# Patient Record
Sex: Female | Born: 1995 | Race: Black or African American | Hispanic: No | Marital: Single | Smoking: Former smoker
Health system: Southern US, Community
[De-identification: ages and names within clinical notes are randomized; demographics above are authoritative.]

---

## 2017-05-14 ENCOUNTER — Emergency Department (HOSPITAL_COMMUNITY): Payer: Self-pay

## 2017-05-14 ENCOUNTER — Emergency Department (HOSPITAL_COMMUNITY)
Admission: EM | Admit: 2017-05-14 | Discharge: 2017-05-14 | Disposition: A | Discharge status: Home / Self Care | Payer: Self-pay | Attending: Emergency Medicine | Admitting: Emergency Medicine

## 2017-05-14 ENCOUNTER — Encounter (HOSPITAL_COMMUNITY): Payer: Self-pay | Admitting: Emergency Medicine

## 2017-05-14 DIAGNOSIS — R509 Fever, unspecified: Secondary | ICD-10-CM | POA: Insufficient documentation

## 2017-05-14 DIAGNOSIS — R1030 Lower abdominal pain, unspecified: Secondary | ICD-10-CM

## 2017-05-14 DIAGNOSIS — N760 Acute vaginitis: Secondary | ICD-10-CM | POA: Insufficient documentation

## 2017-05-14 DIAGNOSIS — B9689 Other specified bacterial agents as the cause of diseases classified elsewhere: Secondary | ICD-10-CM

## 2017-05-14 DIAGNOSIS — Z87891 Personal history of nicotine dependence: Secondary | ICD-10-CM | POA: Insufficient documentation

## 2017-05-14 DIAGNOSIS — R112 Nausea with vomiting, unspecified: Secondary | ICD-10-CM | POA: Insufficient documentation

## 2017-05-14 LAB — WET PREP, GENITAL
SPERM: NONE SEEN
TRICH WET PREP: NONE SEEN
YEAST WET PREP: NONE SEEN

## 2017-05-14 LAB — URINALYSIS, ROUTINE W REFLEX MICROSCOPIC
BILIRUBIN URINE: NEGATIVE
Glucose, UA: NEGATIVE mg/dL
HGB URINE DIPSTICK: NEGATIVE
KETONES UR: NEGATIVE mg/dL
Leukocytes, UA: NEGATIVE
Nitrite: NEGATIVE
PROTEIN: 30 mg/dL — AB
SPECIFIC GRAVITY, URINE: 1.024 (ref 1.005–1.030)
pH: 5 (ref 5.0–8.0)

## 2017-05-14 LAB — COMPREHENSIVE METABOLIC PANEL
ALK PHOS: 54 U/L (ref 38–126)
ALT: 17 U/L (ref 14–54)
AST: 22 U/L (ref 15–41)
Albumin: 4 g/dL (ref 3.5–5.0)
Anion gap: 9 (ref 5–15)
BUN: 10 mg/dL (ref 6–20)
CALCIUM: 9.2 mg/dL (ref 8.9–10.3)
CHLORIDE: 104 mmol/L (ref 101–111)
CO2: 23 mmol/L (ref 22–32)
CREATININE: 0.73 mg/dL (ref 0.44–1.00)
GFR calc non Af Amer: >60 mL/min (ref >60)
GLUCOSE: 83 mg/dL (ref 65–99)
Potassium: 4.2 mmol/L (ref 3.5–5.1)
Sodium: 136 mmol/L (ref 135–145)
Total Bilirubin: 0.4 mg/dL (ref 0.3–1.2)
Total Protein: 7.4 g/dL (ref 6.5–8.1)

## 2017-05-14 LAB — CBC WITH DIFFERENTIAL/PLATELET
BASOS PCT: 0 %
Basophils Absolute: 0 10*3/uL (ref 0.0–0.1)
EOS ABS: 0.1 10*3/uL (ref 0.0–0.7)
Eosinophils Relative: 1 %
HCT: 37.8 % (ref 36.0–46.0)
HEMOGLOBIN: 12.4 g/dL (ref 12.0–15.0)
LYMPHS ABS: 3.7 10*3/uL (ref 0.7–4.0)
Lymphocytes Relative: 29 %
MCH: 24.7 pg — AB (ref 26.0–34.0)
MCHC: 32.8 g/dL (ref 30.0–36.0)
MCV: 75.1 fL — ABNORMAL LOW (ref 78.0–100.0)
MONO ABS: 0.8 10*3/uL (ref 0.1–1.0)
MONOS PCT: 6 %
Neutro Abs: 8.1 10*3/uL — ABNORMAL HIGH (ref 1.7–7.7)
Neutrophils Relative %: 64 %
Platelets: 258 10*3/uL (ref 150–400)
RBC: 5.03 MIL/uL (ref 3.87–5.11)
RDW: 14.8 % (ref 11.5–15.5)
WBC: 12.7 10*3/uL — ABNORMAL HIGH (ref 4.0–10.5)

## 2017-05-14 LAB — POC URINE PREG, ED: Preg Test, Ur: NEGATIVE

## 2017-05-14 LAB — LIPASE, BLOOD: LIPASE: 30 U/L (ref 11–51)

## 2017-05-14 MED ORDER — IOPAMIDOL (ISOVUE-300) INJECTION 61%
INTRAVENOUS | Status: AC
  Administered 2017-05-14: 100 mL
  Filled 2017-05-14: qty 100

## 2017-05-14 MED ORDER — KETOROLAC TROMETHAMINE 30 MG/ML IJ SOLN
30.0000 mg | Freq: Once | INTRAMUSCULAR | Status: AC
  Administered 2017-05-14: 30 mg via INTRAVENOUS
  Filled 2017-05-14: qty 1

## 2017-05-14 MED ORDER — SODIUM CHLORIDE 0.9 % IV BOLUS (SEPSIS)
1000.0000 mL | Freq: Once | INTRAVENOUS | Status: AC
Start: 2017-05-14 — End: 2017-05-14
  Administered 2017-05-14: 1000 mL via INTRAVENOUS

## 2017-05-14 MED ORDER — METRONIDAZOLE 500 MG PO TABS: 500.0000 mg | ORAL_TABLET | Freq: Two times a day (BID) | ORAL | 0 refills | Status: AC

## 2017-05-14 NOTE — ED Notes (Signed)
Pt appears to have started her period. Pt given washcloths, OB wipes, and menstrual pad. Chux pad placed under pt. EDP aware.

## 2017-05-14 NOTE — ED Notes (Addendum)
Pt reports BL flank/back pain and intermittent bilateral leg pain x 1 week worsening these last 3 days. Pt ambulatory to room with steady gait. Pt also reports "translucent grey" vaginal discharge with fish and "different odor" for 3 days. Pt reports she has hx of irregular menstrual cycles and has not had a period for 3 months. Pt reports no chance of pregnancy due to same sex relationship. Denies vaginal bleeding. Pt also reports lower abd pain. Denies vaginal lesions, vaginal swelling, vaginal tearing.

## 2017-05-14 NOTE — ED Notes (Signed)
Patient transported to CT 

## 2017-05-14 NOTE — ED Triage Notes (Signed)
Pt. Stated, I've got back pain, vaginal pain, and a discharge for 3 weeks.

## 2017-05-14 NOTE — ED Notes (Signed)
Pelvic cart at bedside. 

## 2017-05-14 NOTE — ED Notes (Signed)
ED Provider at bedside. 

## 2017-05-14 NOTE — Discharge Instructions (Signed)
Take Flagyl as directed.  It is very important that you do not consume any alcohol while taking this medication as it will cause you to become violently ill.  You can take Tylenol or Ibuprofen as directed for pain. You can alternate Tylenol and Ibuprofen every 4 hours. If you take Tylenol at 1pm, then you can take Ibuprofen at 5pm. Then you can take Tylenol again at 9pm.   Do not take any more ibuprofen tonight as the medication we have given you does contain ibuprofen.   As we discussed, follow-up with the referred OB/GYN For further evaluation. You need to arrange an appointment in the next 2-4 days. I have also included information regarding the [facility] for further primary care evaluation.   Return the emergency Department for worsening abdominal pain, persistent vomiting, fever, chest pain, difficulty breathing, chest pain or any other worsening or concerning symptoms.

## 2017-05-14 NOTE — ED Provider Notes (Signed)
[facility]-EMERGENCY DEPT Provider Note   CSN: [id number] Arrival date & time: 05/14/17  1238     History   Chief Complaint Chief Complaint  Patient presents with  . Back Pain  . Vaginal Pain  . Vaginal Discharge    HPI Catherine Poole is a 21 y.o. female who presents with 4 days of intermittent, progressively worsening lower abdominal pain. Patient reports that pain has become more frequent and severe in nature. She describes it as a "sharp, cramp." She denies any alleviating or aggravating factors. Patient states that she has not taken any medications for the pain. She denies any association with food. Patient also reports for the last 3 days she has had intermittent fever. Patient reports that she checked her fever and it remained between 100.3 and 101. Patient denies any fever today. She has had some nausea and some intermittent episodes of vomiting but states that she is still able to tolerate some food and liquids. Patient also reports some diffuse lower back pain wraps around the front. Patient also notes she has been having some bilateral breast "soreness." She has not noted any warmth, erythema to the bilateral breasts. She does perform routine breast exams at home and has not noted any lumps in the breast. Patient also notes that she's been having some vaginal discharge for the last week. She describes it as a "smelly gray discharge." Patient reports that she isn't sexually active with one female partner. She denies any vaginal foreign bodies inserted into the vaginal canal. Patient reports that she has irregular periods and that this is been ongoing since she started as a teenager. She reports that her LMP was approximately 3 months ago. She reports her last bowel movement was this morning but was hard and she had to strain. Prior to that hospital was proximal 2 days ago. Patient denies any chest pain, difficulty breathing, dysuria, hematuria, dyspareunia. She denies any trauma, injury, or  fall.   The history is provided by the patient.    History reviewed. No pertinent past medical history.  There are no active problems to display for this patient.   History reviewed. No pertinent surgical history.  OB History    No data available       Home Medications    Prior to Admission medications   Medication Sig Start Date End Date Taking? Authorizing Provider  metroNIDAZOLE (FLAGYL) 500 MG tablet Take 1 tablet (500 mg total) by mouth 2 (two) times daily. 05/14/17   Maxwell Caul, PA-C    Family History No family history on file.  Social History Social History  Substance Use Topics  . Smoking status: Former Games developer  . Smokeless tobacco: Former Neurosurgeon  . Alcohol use Yes     Allergies   Patient has no allergy information on record.   Review of Systems Review of Systems  Constitutional: Positive for fever. Negative for chills.  HENT: Negative for congestion.   Eyes: Negative for visual disturbance.  Respiratory: Negative for cough and shortness of breath.   Cardiovascular: Negative for chest pain.  Gastrointestinal: Positive for abdominal pain, nausea and vomiting. Negative for diarrhea.  Genitourinary: Positive for vaginal discharge and vaginal pain. Negative for dysuria, hematuria and vaginal bleeding.  Musculoskeletal: Positive for back pain. Negative for neck pain.  Neurological: Negative for dizziness, weakness and numbness.     Physical Exam Updated Vital Signs BP 120/86 (BP Location: Left Arm)   Pulse 83   Temp (S) 98.3 F (36.8 C) (Oral)  Resp 16   Ht  (1.676 m)   Wt 96.2 kg (212 lb)   LMP 02/11/2017   SpO2 100%   BMI 34.22 kg/m   Physical Exam  Constitutional: She is oriented to person, place, and time. She appears well-developed and well-nourished.  Sitting comfortably on examination table  HENT:  Head: Normocephalic and atraumatic.  Mouth/Throat: Uvula is midline, oropharynx is clear and moist and mucous membranes are  normal.  Eyes: Pupils are equal, round, and reactive to light. Conjunctivae, EOM and lids are normal.  Neck: Full passive range of motion without pain.  Cardiovascular: Normal rate, regular rhythm, normal heart sounds and normal pulses.  Exam reveals no gallop and no friction rub.   No murmur heard. Pulmonary/Chest: Effort normal and breath sounds normal.  No evidence of respiratory distress. Able to speak in full sentences without difficulty.  The exam was performed with a chaperone present. Normal breast exam. No evidence of breast mass bilaterally. No abnormalities of the Ariel or nipple bilaterally.  Abdominal: Soft. Normal appearance. She exhibits no distension. Bowel sounds are decreased. There is tenderness in the right lower quadrant, suprapubic area and left lower quadrant. There is tenderness at McBurney's point. There is no rigidity, no guarding, no CVA tenderness and negative Murphy's sign.  Abdomen soft, nondistended. Patient has diffuse lower quadrant tenderness to both the bilateral quadrants and suprapubic region. Patient has some mild McBurney's point tenderness.  Genitourinary: Uterus normal. Cervix exhibits no motion tenderness and no friability. Right adnexum displays no mass and no tenderness. Left adnexum displays tenderness. Left adnexum displays no mass. There is bleeding in the vagina.  Genitourinary Comments: The exam was performed with a chaperone present. Normal external female genitalia. No lesions, rash, or sores. Bleeding noted to the vaginal canal. Patient had some mild tenderness with insertion of the bimanual exam but no CMT. No adnexal tenderness to left. No masses noted bilaterally.  Musculoskeletal: Normal range of motion.  Lymphadenopathy:    She has no axillary adenopathy.  Neurological: She is alert and oriented to person, place, and time.  Skin: Skin is warm and dry. Capillary refill takes less than 2 seconds.  Psychiatric: She has a normal mood and affect.  Her speech is normal.  Nursing note and vitals reviewed.    ED Treatments / Results  Labs (all labs ordered are listed, but only abnormal results are displayed) Labs Reviewed  WET PREP, GENITAL - Abnormal; Notable for the following:       Result Value   Clue Cells Wet Prep HPF POC PRESENT (*)    WBC, Wet Prep HPF POC FEW (*)    All other components within normal limits  URINALYSIS, ROUTINE W REFLEX MICROSCOPIC - Abnormal; Notable for the following:    Color, Urine AMBER (*)    APPearance CLOUDY (*)    Protein, ur 30 (*)    Bacteria, UA RARE (*)    Squamous Epithelial / LPF 6-30 (*)    All other components within normal limits  CBC WITH DIFFERENTIAL/PLATELET - Abnormal; Notable for the following:    WBC 12.7 (*)    MCV 75.1 (*)    MCH 24.7 (*)    Neutro Abs 8.1 (*)    All other components within normal limits  COMPREHENSIVE METABOLIC PANEL  LIPASE, BLOOD  POC URINE PREG, ED  GC/CHLAMYDIA PROBE AMP () NOT AT [facility]    EKG  EKG Interpretation None       Radiology No results  found.  Procedures Procedures (including critical care time)  Medications Ordered in ED Medications  sodium chloride 0.9 % bolus 1,000 mL (0 mLs Intravenous Stopped 05/14/17 1915)  ketorolac (TORADOL) 30 MG/ML injection 30 mg (30 mg Intravenous Given 05/14/17 1913)  iopamidol (ISOVUE-300) 61 % injection (100 mLs  Contrast Given 05/14/17 1922)     Initial Impression / Assessment and Plan / ED Course  I have reviewed the triage vital signs and the nursing notes.  Pertinent labs & imaging results that were available during my care of the patient were reviewed by me and considered in my medical decision making (see chart for details).     21 year old female who presents with 4 days of lower abdominal pain, back pain and vaginal discharge. Patient does report a history of intermittent fevers, nausea/vomiting but states she has been able to tolerate by mouth. Patient is afebrile, non-toxic  appearing, sitting comfortably on examination table. Vital signs reviewed and stable. Consider acute infectious etiology vs UTI vs PMS vs pregnancy vs GU etiology vs constipation. Also consider appendicitis given history/physical exam. History/physical examination concerning for pyelonephritis, kidney stone, diverticulitis or ovarian torsion. Will plan to check basic labs including CBC, lipase, CMP, UA, urine pregnancy. IVF given for fluid resuscitation Analgesics provided in the department. Will also evaluate KUB for evaluation of constipation.   Patient reports that while she was here, she started her period. Patient provided with appropriate sanitary napkins.  Labs and imaging reviewed. CBC shows slight leukocytosis. Lipase unremarkable. CMP unremarkable. UA shows no hemoglobin. Urine pregnancy. KUB shows small phlebolith in the RLQ. Given leukocytosis and pain, will obtain CT abd/pelvis.   Pelvic exam as documented above. Pelvic exam was not concerning for PID. Patient did have some vaginal bleeding as she indicated that she just started her period while here in the department. No CMT, no friability. No adnexal mass.  Reevaluation. Patient reports improvement in abdominal pain after medication. Repeat abdominal exam shows improvement and lower abdominal tenderness. She still has some mild tenderness to the right lower quadrant but it has been improved since the initial exam. CT abdomen and pelvis pending.  Wet prep reviewed. Is positive for clue cells. Discussed results with patient. Explained to her that the gonorrhea and chlamydia will come back in the next 2-3 days. Explained that we can go in and provide treatment if she is having discharge or symptoms or she can wait until the results are back. Patient does not wish to be treated for GC/chlamydia at this time.  CT abdomen and pelvis reviewed. Negative for any acute appendicitis. No other acute abnormalities. Discussed results with patient.  Repeat abdominal exam shows improved abdominal tenderness. Patient reports feeling better. We discussed at length with obtaining an ultrasound for evaluation of potential ovarian cyst. I do not suspect ovarian cyst or torsion as the cause of patient's pain. Patient does not wish to have an ultrasound done at this time. I feel this is reasonable given patient's history/physical exam. Symptoms likely accommodation obesity and patient concerning her. After 3 months. Will need outpatient OB/GYN follow-up. We'll plan to by mouth challenge patient in the department.   Patient able tolerate by mouth without any difficulty. Vital signs reviewed and stable. Patient provided with outpatient referral to OB/GYN. Instructed her to follow up with them in the next 24-48 hours for further evaluation. Strict return precautions discussed. Patient expresses understanding and agreement to plan.     Final Clinical Impressions(s) / ED Diagnoses   Final diagnoses:  Lower abdominal pain  Bacterial vaginosis    New Prescriptions Discharge Medication List as of 05/14/2017  8:37 PM    START taking these medications   Details  metroNIDAZOLE (FLAGYL) 500 MG tablet Take 1 tablet (500 mg total) by mouth 2 (two) times daily., Starting Sat 05/14/2017, Print         Maxwell Caul, PA-C 05/17/17 0007    Nira Conn, MD 05/19/17 [phone]

## 2017-05-14 NOTE — ED Notes (Signed)
Pt returned from CT °

## 2017-05-14 NOTE — ED Notes (Signed)
Pt given gingerale, graham crackers, and saltine crackers.

## 2017-05-16 LAB — GC/CHLAMYDIA PROBE AMP (~~LOC~~) NOT AT ARMC
Chlamydia: NEGATIVE
NEISSERIA GONORRHEA: NEGATIVE

## 2017-05-23 ENCOUNTER — Encounter: Payer: Self-pay | Admitting: Obstetrics & Gynecology

## 2018-10-19 IMAGING — CT CT ABD-PELV W/ CM
2 of 4 series · 16 of 46 positions shown, 18 images · IV contrast (iopamidol)
Comparison: None

CLINICAL DATA: Right lower quadrant pain for 2 weeks

EXAM:
CT ABDOMEN AND PELVIS WITH CONTRAST
TECHNIQUE: Multidetector CT imaging of the abdomen and pelvis was performed
using the standard protocol following bolus administration of
intravenous contrast.
CONTRAST:  100mL 2UZOPY-L11 IOPAMIDOL (2UZOPY-L11) INJECTION 61%

[Series 3: abd/ pelvis 5.0 i30f 2 · axial · 0.98mm/px · z∈[+705,+1195]mm · 13 of 108 slices shown, 15 images]
[im 5/108  soft-tissue]
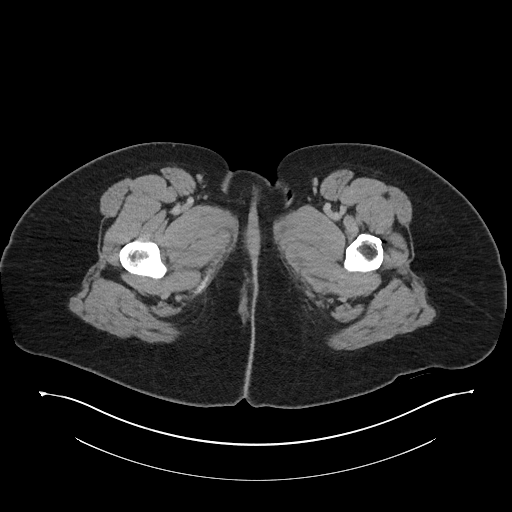
[im 5/108  bone]
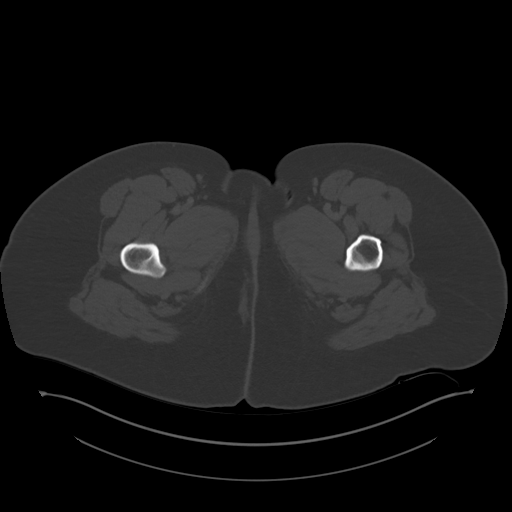
[im 14/108  soft-tissue]
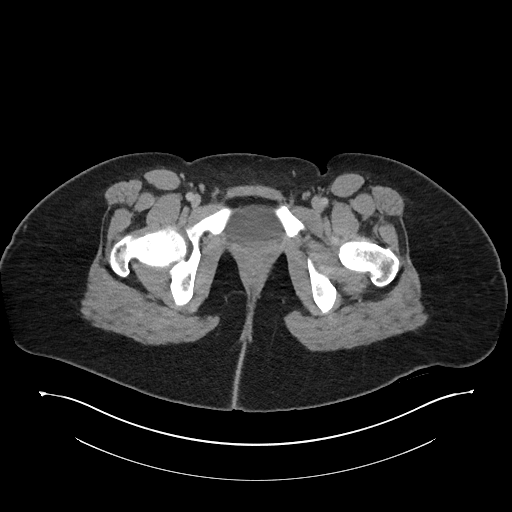
[im 24/108  soft-tissue]
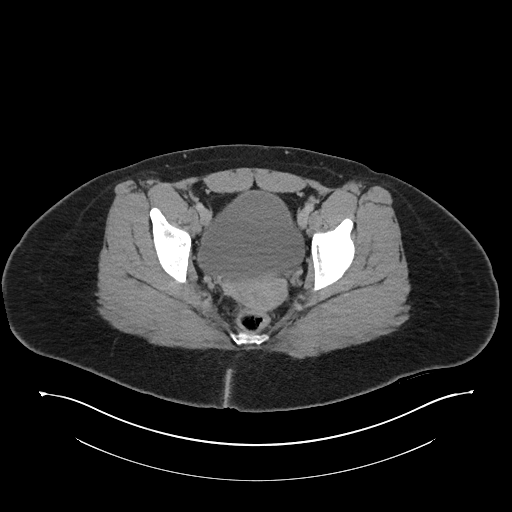
[im 28/108  soft-tissue]
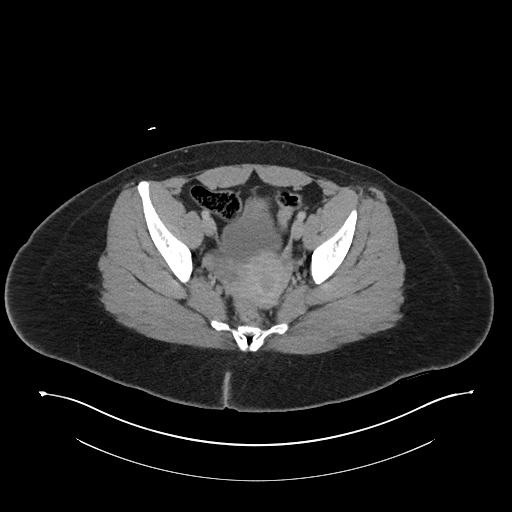
[im 38/108  soft-tissue]
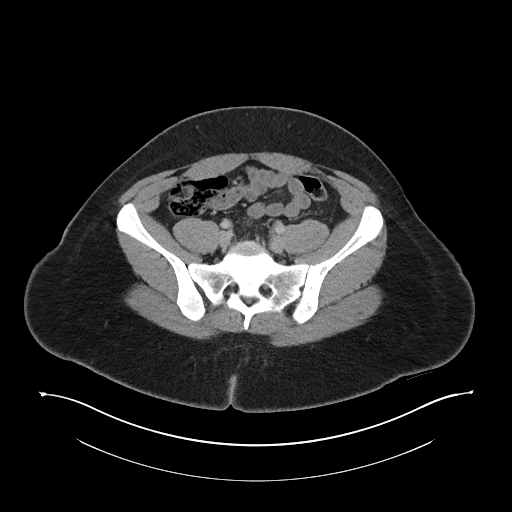
[im 47/108  soft-tissue]
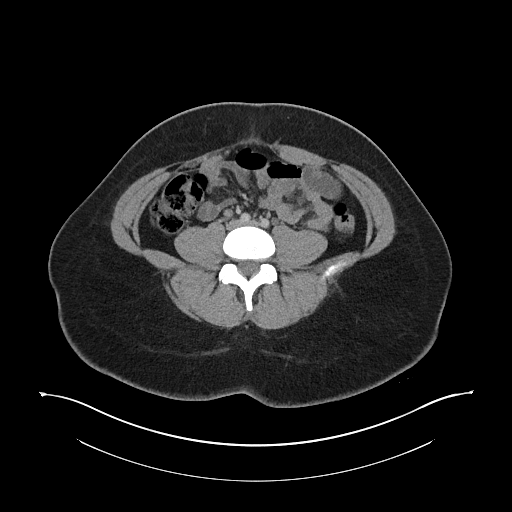
[im 56/108  soft-tissue]
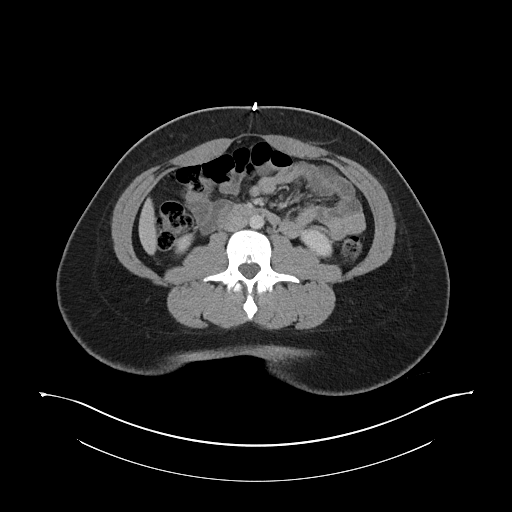
[im 61/108  soft-tissue]
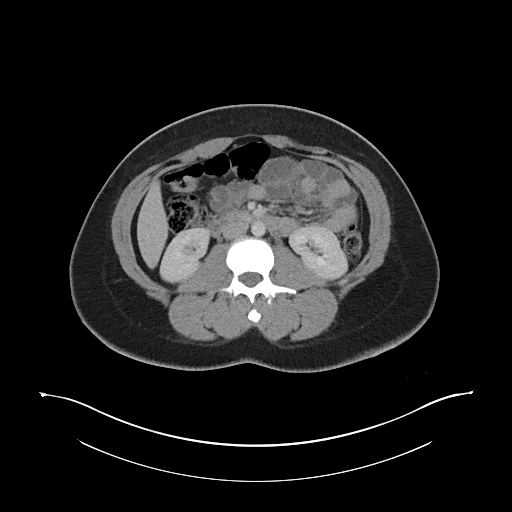
[im 70/108  soft-tissue]
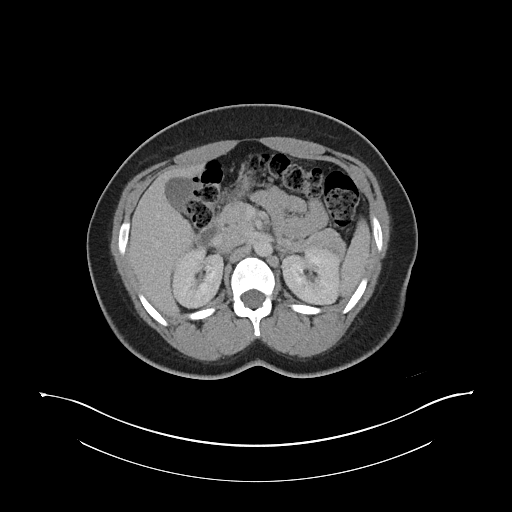
[im 70/108  bone]
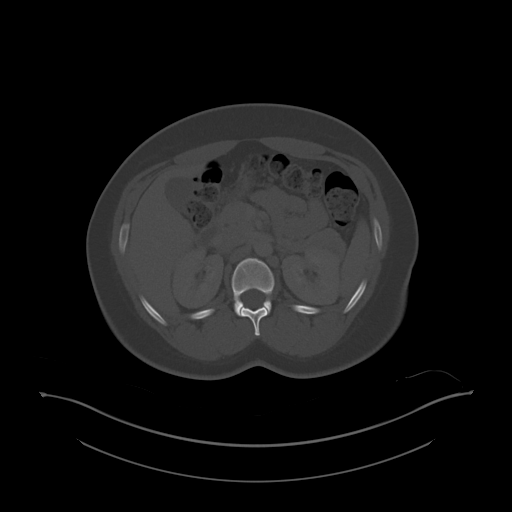
[im 80/108  soft-tissue]
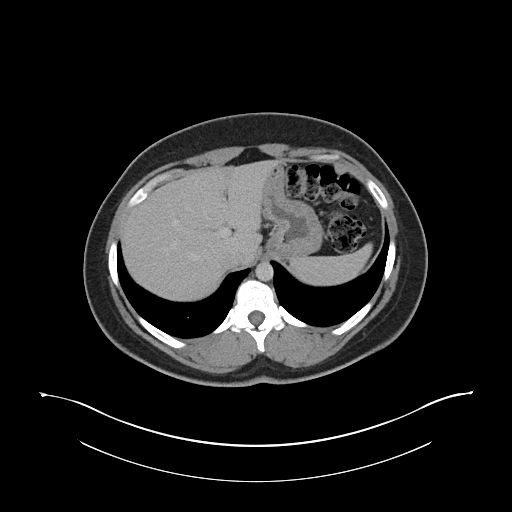
[im 84/108  soft-tissue]
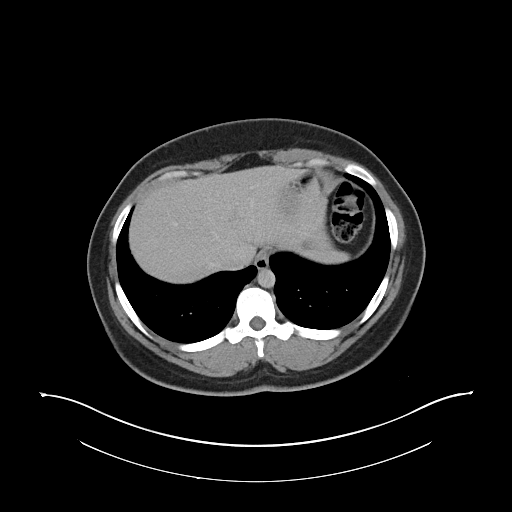
[im 94/108  soft-tissue]
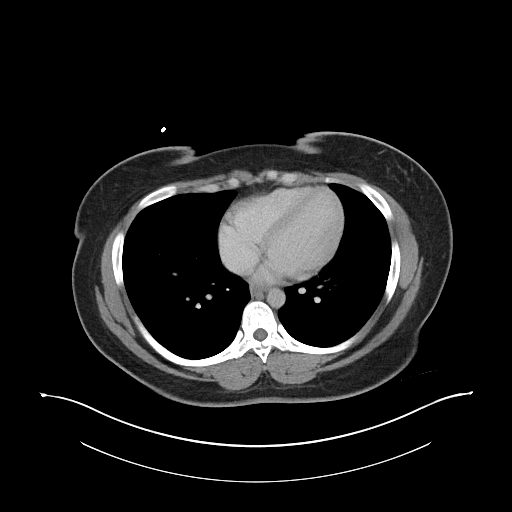
[im 103/108  soft-tissue]
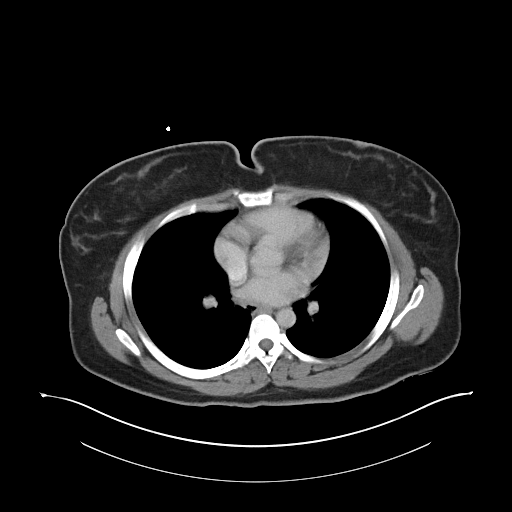

[Series 6: coronal soft tissue · coronal · 0.79mm/px · 3 of 101 slices shown]
[im 34/101  soft-tissue]
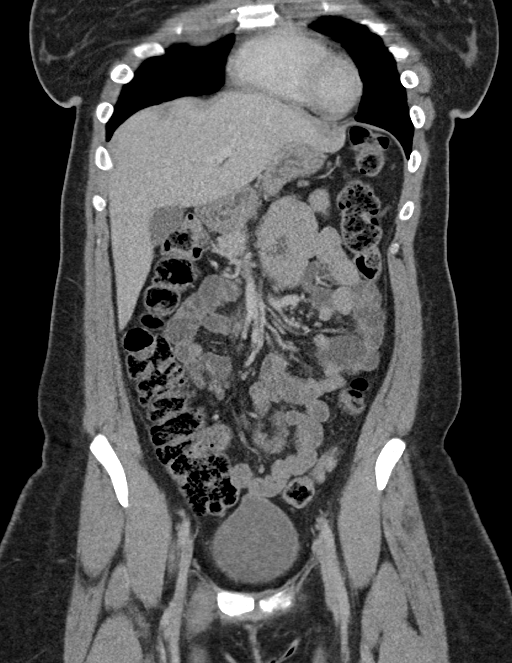
[im 45/101  soft-tissue]
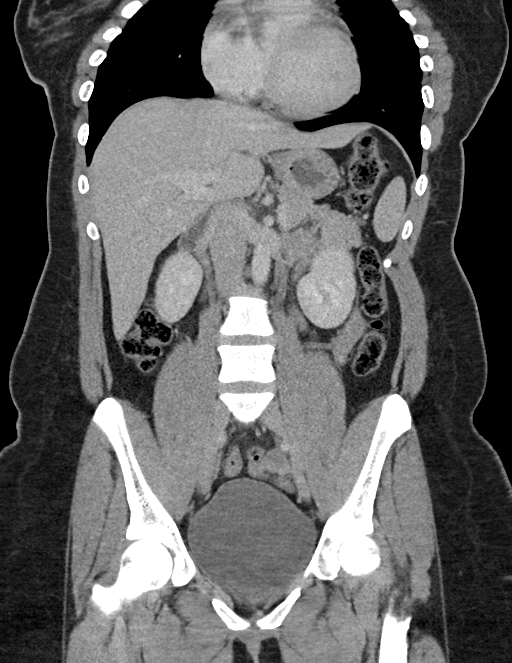
[im 56/101  soft-tissue]
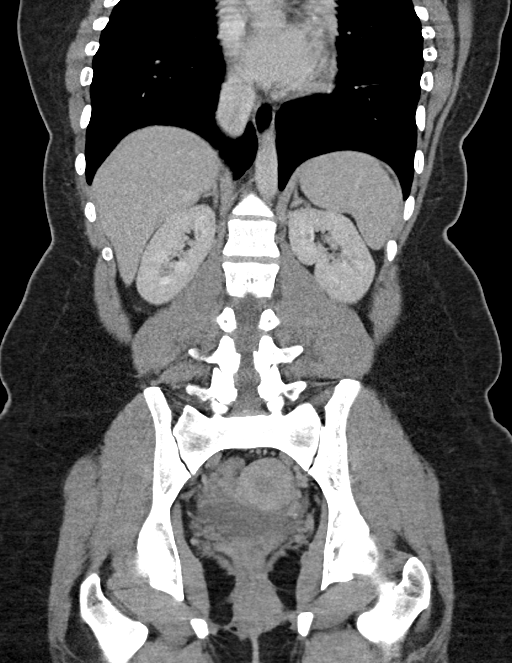

[16 of 46 positions shown; findings below may reference images not displayed]

FINDINGS: Lower chest: No acute abnormality.

Hepatobiliary: No focal liver abnormality is seen. No gallstones,
gallbladder wall thickening, or biliary dilatation.

Pancreas: Unremarkable. No pancreatic ductal dilatation or
surrounding inflammatory changes.

Spleen: Normal in size without focal abnormality.

Adrenals/Urinary Tract: The adrenal glands appear normal.
Unremarkable appearance of the kidneys. No hydronephrosis or mass.
The urinary bladder appears normal.

Stomach/Bowel: The stomach is normal. The small bowel loops have a
normal course and caliber. The appendix is visualized and appears
normal. Normal appearance of the colon

Vascular/Lymphatic: Normal appearance of the abdominal aorta. No
enlarged retroperitoneal or mesenteric adenopathy. No enlarged
pelvic or inguinal lymph nodes.

Reproductive: Uterus and bilateral adnexa are unremarkable.

Other: Umbilical hernia contains fat only.

Musculoskeletal: No acute or significant osseous findings.
IMPRESSION: 1. No acute findings within the abdomen or pelvis.
2. The appendix is visualized and appears normal.
